# Patient Record
Sex: Male | Born: 1980 | Race: Black or African American | Hispanic: No | Marital: Single | Smoking: Current every day smoker
Health system: Southern US, Community
[De-identification: ages and names within clinical notes are randomized; demographics above are authoritative.]

---

## 2003-09-25 ENCOUNTER — Emergency Department (HOSPITAL_COMMUNITY): Admission: EM | Admit: 2003-09-25 | Discharge: 2003-09-25 | Payer: Self-pay | Admitting: Emergency Medicine

## 2003-11-22 ENCOUNTER — Emergency Department (HOSPITAL_COMMUNITY): Admission: AD | Admit: 2003-11-22 | Discharge: 2003-11-22 | Payer: Self-pay | Admitting: Emergency Medicine

## 2004-06-12 ENCOUNTER — Emergency Department (HOSPITAL_COMMUNITY): Admission: AC | Admit: 2004-06-12 | Discharge: 2004-06-12 | Payer: Self-pay

## 2016-12-10 ENCOUNTER — Encounter (HOSPITAL_COMMUNITY): Payer: Self-pay

## 2016-12-10 ENCOUNTER — Emergency Department (HOSPITAL_COMMUNITY)
Admission: EM | Admit: 2016-12-10 | Discharge: 2016-12-10 | Disposition: A | Discharge status: Home / Self Care | Payer: No Typology Code available for payment source | Attending: Emergency Medicine | Admitting: Emergency Medicine

## 2016-12-10 DIAGNOSIS — S39012A Strain of muscle, fascia and tendon of lower back, initial encounter: Secondary | ICD-10-CM | POA: Insufficient documentation

## 2016-12-10 DIAGNOSIS — Y999 Unspecified external cause status: Secondary | ICD-10-CM | POA: Insufficient documentation

## 2016-12-10 DIAGNOSIS — Y939 Activity, unspecified: Secondary | ICD-10-CM | POA: Diagnosis not present

## 2016-12-10 DIAGNOSIS — M62838 Other muscle spasm: Secondary | ICD-10-CM

## 2016-12-10 DIAGNOSIS — T148XXA Other injury of unspecified body region, initial encounter: Secondary | ICD-10-CM

## 2016-12-10 DIAGNOSIS — S29012A Strain of muscle and tendon of back wall of thorax, initial encounter: Secondary | ICD-10-CM | POA: Diagnosis not present

## 2016-12-10 DIAGNOSIS — Y92410 Unspecified street and highway as the place of occurrence of the external cause: Secondary | ICD-10-CM | POA: Insufficient documentation

## 2016-12-10 DIAGNOSIS — S299XXA Unspecified injury of thorax, initial encounter: Secondary | ICD-10-CM | POA: Diagnosis present
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
## 2016-12-13 ENCOUNTER — Ambulatory Visit (INDEPENDENT_AMBULATORY_CARE_PROVIDER_SITE_OTHER): Payer: Self-pay | Admitting: Physician Assistant

## 2016-12-13 ENCOUNTER — Telehealth (INDEPENDENT_AMBULATORY_CARE_PROVIDER_SITE_OTHER): Payer: Self-pay | Admitting: Physician Assistant

## 2016-12-13 ENCOUNTER — Ambulatory Visit (HOSPITAL_COMMUNITY)
Admission: RE | Admit: 2016-12-13 | Discharge: 2016-12-13 | Disposition: A | Discharge status: Home / Self Care | Payer: No Typology Code available for payment source | Source: Ambulatory Visit | Attending: Physician Assistant | Admitting: Physician Assistant

## 2016-12-13 ENCOUNTER — Encounter (INDEPENDENT_AMBULATORY_CARE_PROVIDER_SITE_OTHER): Payer: Self-pay | Admitting: Physician Assistant

## 2016-12-13 VITALS — BP 125/66 | HR 81 | Temp 98.0°F | Ht 66.0 in | Wt 157.6 lb

## 2016-12-13 DIAGNOSIS — M545 Low back pain: Secondary | ICD-10-CM | POA: Insufficient documentation

## 2016-12-13 DIAGNOSIS — M5134 Other intervertebral disc degeneration, thoracic region: Secondary | ICD-10-CM | POA: Insufficient documentation

## 2016-12-13 DIAGNOSIS — M5136 Other intervertebral disc degeneration, lumbar region: Secondary | ICD-10-CM | POA: Insufficient documentation

## 2016-12-13 DIAGNOSIS — S39012A Strain of muscle, fascia and tendon of lower back, initial encounter: Secondary | ICD-10-CM

## 2016-12-13 DIAGNOSIS — S3992XA Unspecified injury of lower back, initial encounter: Secondary | ICD-10-CM

## 2016-12-13 MED ORDER — CYCLOBENZAPRINE HCL 5 MG PO TABS: 5.0000 mg | ORAL_TABLET | Freq: Three times a day (TID) | ORAL | 0 refills | Status: DC | PRN

## 2016-12-13 MED ORDER — IBUPROFEN 600 MG PO TABS: 600.0000 mg | ORAL_TABLET | Freq: Three times a day (TID) | ORAL | 0 refills | Status: DC | PRN

## 2016-12-13 NOTE — Telephone Encounter (Signed)
[facility]

## 2016-12-13 NOTE — Patient Instructions (Signed)
Back Pain, Adult  Back pain is very common. The pain often gets better over time. The cause of back pain is usually not dangerous. Most people can learn to manage their back pain on their own.  Follow these instructions at home:  Watch your back pain for any changes. The following actions may help to lessen any pain you are feeling:  · Stay active. Start with short walks on flat ground if you can. Try to walk farther each day.  · Exercise regularly as told by your doctor. Exercise helps your back heal faster. It also helps avoid future injury by keeping your muscles strong and flexible.  · Do not sit, drive, or stand in one place for more than 30 minutes.  · Do not stay in bed. Resting more than 1-2 days can slow down your recovery.  · Be careful when you bend or lift an object. Use good form when lifting:  ? Bend at your knees.  ? Keep the object close to your body.  ? Do not twist.  · Sleep on a firm mattress. Lie on your side, and bend your knees. If you lie on your back, put a pillow under your knees.  · Take medicines only as told by your doctor.  · Put ice on the injured area.  ? Put ice in a plastic bag.  ? Place a towel between your skin and the bag.  ? Leave the ice on for 20 minutes, 2-3 times a day for the first 2-3 days. After that, you can switch between ice and heat packs.  · Avoid feeling anxious or stressed. Find good ways to deal with stress, such as exercise.  · Maintain a healthy weight. Extra weight puts stress on your back.    Contact a doctor if:  · You have pain that does not go away with rest or medicine.  · You have worsening pain that goes down into your legs or buttocks.  · You have pain that does not get better in one week.  · You have pain at night.  · You lose weight.  · You have a fever or chills.  Get help right away if:  · You cannot control when you poop (bowel movement) or pee (urinate).  · Your arms or legs feel weak.  · Your arms or legs lose feeling (numbness).  · You feel sick  to your stomach (nauseous) or throw up (vomit).  · You have belly (abdominal) pain.  · You feel like you may pass out (faint).  This information is not intended to replace advice given to you by your health care provider. Make sure you discuss any questions you have with your health care provider.  Document Released: 03/13/2008 Document Revised: 03/02/2016 Document Reviewed: 01/27/2014  Elsevier Interactive Patient Education © 2017 [employer]
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
## 2016-12-14 MED ORDER — CYCLOBENZAPRINE HCL 5 MG PO TABS: 5.0000 mg | ORAL_TABLET | Freq: Three times a day (TID) | ORAL | 0 refills | Status: DC | PRN

## 2016-12-14 MED ORDER — IBUPROFEN 600 MG PO TABS: 600.0000 mg | ORAL_TABLET | Freq: Three times a day (TID) | ORAL | 0 refills | Status: DC | PRN

## 2016-12-14 NOTE — Telephone Encounter (Signed)
Will send meds to [facility] now.

## 2016-12-14 NOTE — Addendum Note (Signed)
Addended by: Sindy MessingGOMEZ, Vincente Asbridge D on: 12/14/2016 08:30 AM   Modules accepted: Orders

## 2017-01-10 ENCOUNTER — Ambulatory Visit (INDEPENDENT_AMBULATORY_CARE_PROVIDER_SITE_OTHER): Payer: Self-pay | Admitting: Physician Assistant

## 2017-01-10 ENCOUNTER — Encounter (INDEPENDENT_AMBULATORY_CARE_PROVIDER_SITE_OTHER): Payer: Self-pay | Admitting: Physician Assistant

## 2017-01-10 VITALS — BP 113/71 | HR 84 | Temp 98.1°F | Ht 66.0 in | Wt 149.2 lb

## 2017-01-10 DIAGNOSIS — S39012D Strain of muscle, fascia and tendon of lower back, subsequent encounter: Secondary | ICD-10-CM

## 2017-01-10 MED ORDER — IBUPROFEN 600 MG PO TABS: 600.0000 mg | ORAL_TABLET | Freq: Three times a day (TID) | ORAL | 0 refills | Status: DC | PRN

## 2017-01-10 NOTE — Progress Notes (Signed)
Pt presents for a F/U for back pain  Pt states his back feels better, but the type of work he does make it worse  Pt states that heat and ice does help with the pain  Pt does not complain of any pain today  Pt states he is not taking the prescribed medication, as he is not a fan of medication
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
## 2017-01-10 NOTE — Patient Instructions (Signed)
Low Back Strain A strain is a stretch or tear in a muscle or the strong cords of tissue that attach muscle to bone (tendons). Strains of the lower back (lumbar spine) are a common cause of low back pain. A strain occurs when muscles or tendons are torn or are stretched beyond their limits. The muscles may become inflamed, resulting in pain and sudden muscle tightening (spasms). A strain can happen suddenly due to an injury (trauma), or it can develop gradually due to overuse. There are three types of strains:  Grade 1 is a mild strain involving a minor tear of the muscle fibers or tendons. This may cause some pain but no loss of muscle strength.  Grade 2 is a moderate strain involving a partial tear of the muscle fibers or tendons. This causes more severe pain and some loss of muscle strength.  Grade 3 is a severe strain involving a complete tear of the muscle or tendon. This causes severe pain and complete or nearly complete loss of muscle strength. What are the causes? This condition may be caused by:  Trauma, such as a fall or a hit to the body.  Twisting or overstretching the back. This may result from doing activities that require a lot of energy, such as lifting heavy objects. What increases the risk? The following factors may increase your risk of getting this condition:  Playing contact sports.  Participating in sports or activities that put excessive stress on the back and require a lot of bending and twisting, including:  Lifting weights or heavy objects.  Gymnastics.  Soccer.  Figure skating.  Snowboarding.  Being overweight or obese.  Having poor strength and flexibility. What are the signs or symptoms? Symptoms of this condition may include:  Sharp or dull pain in the lower back that does not go away. Pain may extend to the buttocks.  Stiffness.  Limited range of motion.  Inability to stand up straight due to stiffness or pain.  Muscle spasms. How is this  diagnosed?   This condition may be diagnosed based on:  Your symptoms.  Your medical history.  A physical exam.  Your health care provider may push on certain areas of your back to determine the source of your pain.  You may be asked to bend forward, backward, and side to side to assess the severity of your pain and your range of motion.  Imaging tests, such as:  X-rays.  MRI. How is this treated? Treatment for this condition may include:  Applying heat and cold to the affected area.  Medicines to help relieve pain and to relax your muscles (muscle relaxants).  NSAIDs to help reduce swelling and discomfort.  Physical therapy. When your symptoms improve, it is important to gradually return to your normal routine as soon as possible to reduce pain, avoid stiffness, and avoid loss of muscle strength. Generally, symptoms should improve within 6 weeks of treatment. However, recovery time varies. Follow these instructions at home: Managing pain, stiffness, and swelling  If directed, apply ice to the injured area during the first 24 hours after your injury.  Put ice in a plastic bag.  Place a towel between your skin and the bag.  Leave the ice on for 20 minutes, 2-3 times a day.  If directed, apply heat to the affected area as often as told by your health care provider. Use the heat source that your health care provider recommends, such as a moist heat pack or a heating pad.    Place a towel between your skin and the heat source.  Leave the heat on for 20-30 minutes.  Remove the heat if your skin turns bright red. This is especially important if you are unable to feel pain, heat, or cold. You may have a greater risk of getting burned. Activity  Rest and return to your normal activities as told by your health care provider. Ask your health care provider what activities are safe for you.  Avoid activities that take a lot of effort (are strenuous) for as long as told by your  health care provider.  Do exercises as told by your health care provider. General instructions  Take over-the-counter and prescription medicines only as told by your health care provider.  If you have questions or concerns about safety while taking pain medicine, talk with your health care provider.  Do not drive or operate heavy machinery until you know how your pain medicine affects you.  Do not use any tobacco products, such as cigarettes, chewing tobacco, and e-cigarettes. Tobacco can delay bone healing. If you need help quitting, ask your health care provider.  Keep all follow-up visits as told by your health care provider. This is important. How is this prevented?  Warm up and stretch before being active.  Cool down and stretch after being active.  Give your body time to rest between periods of activity.  Avoid:  Being physically inactive for long periods at a time.  Exercising or playing sports when you are tired or in pain.  Use correct form when playing sports and lifting heavy objects.  Use good posture when sitting and standing.  Maintain a healthy weight.  Sleep on a mattress with medium firmness to support your back.  Make sure to use equipment that fits you, including shoes that fit well.  Be safe and responsible while being active to avoid falls.  Do at least 150 minutes of moderate-intensity exercise each week, such as brisk walking or water aerobics. Try a form of exercise that takes stress off your back, such as swimming or stationary cycling.  Maintain physical fitness, including:  Strength.  Flexibility.  Cardiovascular fitness.  Endurance. Contact a health care provider if:  Your back pain does not improve after 6 weeks of treatment.  Your symptoms get worse. Get help right away if:  Your back pain is severe.  You are unable to stand or walk.  You develop pain in your legs.  You develop weakness in your buttocks or legs.  You have  difficulty controlling when you urinate or when you have a bowel movement. This information is not intended to replace advice given to you by your health care provider. Make sure you discuss any questions you have with your health care provider. Document Released: 09/25/2005 Document Revised: 06/01/2016 Document Reviewed: 07/07/2015 Elsevier Interactive Patient Education  2017 [employer]  

## 2017-01-16 ENCOUNTER — Ambulatory Visit: Payer: No Typology Code available for payment source | Attending: Physician Assistant

## 2017-01-16 DIAGNOSIS — R293 Abnormal posture: Secondary | ICD-10-CM | POA: Insufficient documentation

## 2017-01-16 DIAGNOSIS — M62830 Muscle spasm of back: Secondary | ICD-10-CM

## 2017-01-16 DIAGNOSIS — S39012D Strain of muscle, fascia and tendon of lower back, subsequent encounter: Secondary | ICD-10-CM | POA: Insufficient documentation

## 2017-01-16 DIAGNOSIS — M25652 Stiffness of left hip, not elsewhere classified: Secondary | ICD-10-CM | POA: Diagnosis present

## 2017-01-16 DIAGNOSIS — M2560 Stiffness of unspecified joint, not elsewhere classified: Secondary | ICD-10-CM | POA: Diagnosis present

## 2017-01-16 DIAGNOSIS — X58XXXD Exposure to other specified factors, subsequent encounter: Secondary | ICD-10-CM | POA: Insufficient documentation

## 2017-01-16 NOTE — Patient Instructions (Addendum)
From cabinet stretching  bilateral knee to chest , LTR, PPT, hamstring stretch   2x/da y  30 sec or more 2-3 reps                                                                                                                                              Sleeping on Back  Place pillow under knees. A pillow with cervical support and a roll around waist are also helpful. Copyright  VHI. All rights reserved.  Sleeping on Side Place pillow between knees. Use cervical support under neck and a roll around waist as needed. Copyright  VHI. All rights reserved.   Sleeping on Stomach   If this is the only desirable sleeping position, place pillow under lower legs, and under stomach or chest as needed.  Posture - Sitting   Sit upright, head facing forward. Try using a roll to support lower back. Keep shoulders relaxed, and avoid rounded back. Keep hips level with knees. Avoid crossing legs for long periods. Stand to Sit / Sit to Stand   To sit: Bend knees to lower self onto front edge of chair, then scoot back on seat. To stand: Reverse sequence by placing one foot forward, and scoot to front of seat. Use rocking motion to stand up.   Work Height and Reach  Ideal work height is no more than 2 to 4 inches below elbow level when standing, and at elbow level when sitting. Reaching should be limited to arm's length, with elbows slightly bent.  Bending  Bend at hips and knees, not back. Keep feet shoulder-width apart.    Posture - Standing   Good posture is important. Avoid slouching and forward head thrust. Maintain curve in low back and align ears over shoul- ders, hips over ankles.  Alternating Positions   Alternate tasks and change positions frequently to reduce fatigue and muscle tension. Take rest breaks. Computer Work   Position work to Art gallery manager. Use proper work and seat height. Keep shoulders back and down, wrists straight, and elbows at right angles. Use chair that provides  full back support. Add footrest and lumbar roll as needed.  Getting Into / Out of Car  Lower self onto seat, scoot back, then bring in one leg at a time. Reverse sequence to get out.  Dressing  Lie on back to pull socks or slacks over feet, or sit and bend leg while keeping back straight.    Housework - Sink  Place one foot on ledge of cabinet under sink when standing at sink for prolonged periods.   Pushing / Pulling  Pushing is preferable to pulling. Keep back in proper alignment, and use leg muscles to do the work.  Deep Squat   Squat and lift with both arms held against upper trunk. Tighten stomach muscles without holding breath. Use smooth movements to avoid jerking.  Avoid Twisting   Avoid twisting or bending back. Pivot around using foot movements, and bend at knees if needed when reaching for articles.  Carrying Luggage   Distribute weight evenly on both sides. Use a cart whenever possible. Do not twist trunk. Move body as a unit.   Lifting Principles .Maintain proper posture and head alignment. .Slide object as close as possible before lifting. .Move obstacles out of the way. .Test before lifting; ask for help if too heavy. .Tighten stomach muscles without holding breath. .Use smooth movements; do not jerk. .Use legs to do the work, and pivot with feet. .Distribute the work load symmetrically and close to the center of trunk. .Push instead of pull whenever possible.   Ask For Help   Ask for help and delegate to others when possible. Coordinate your movements when lifting together, and maintain the low back curve.  Log Roll   Lying on back, bend left knee and place left arm across chest. Roll all in one movement to the right. Reverse to roll to the left. Always move as one unit. Housework - Sweeping  Use long-handled equipment to avoid stooping.   Housework - Wiping  Position yourself as close as possible to reach work surface. Avoid straining  your back.  Laundry - Unloading Wash   To unload small items at bottom of washer, lift leg opposite to arm being used to reach.  Gardening - Raking  Move close to area to be raked. Use arm movements to do the work. Keep back straight and avoid twisting.     Cart  When reaching into cart with one arm, lift opposite leg to keep back straight.   Getting Into / Out of Bed  Lower self to lie down on one side by raising legs and lowering head at the same time. Use arms to assist moving without twisting. Bend both knees to roll onto back if desired. To sit up, start from lying on side, and use same move-ments in reverse. Housework - Vacuuming  Hold the vacuum with arm held at side. Step back and forth to move it, keeping head up. Avoid twisting.   Laundry - Armed forces training and education officer so that bending and twisting can be avoided.   Laundry - Unloading Dryer  Squat down to reach into clothes dryer or use a reacher.  Gardening - Weeding / Psychiatric nurse or Kneel. Knee pads may be helpful.
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
## 2017-01-19 ENCOUNTER — Ambulatory Visit: Payer: No Typology Code available for payment source | Admitting: Physical Therapy

## 2017-01-19 ENCOUNTER — Encounter: Payer: Self-pay | Admitting: Physical Therapy

## 2017-01-19 DIAGNOSIS — S39012D Strain of muscle, fascia and tendon of lower back, subsequent encounter: Secondary | ICD-10-CM | POA: Diagnosis not present

## 2017-01-19 DIAGNOSIS — M62830 Muscle spasm of back: Secondary | ICD-10-CM

## 2017-01-19 DIAGNOSIS — M25652 Stiffness of left hip, not elsewhere classified: Secondary | ICD-10-CM

## 2017-01-19 DIAGNOSIS — R293 Abnormal posture: Secondary | ICD-10-CM

## 2017-01-19 DIAGNOSIS — M2560 Stiffness of unspecified joint, not elsewhere classified: Secondary | ICD-10-CM

## 2017-01-19 NOTE — Therapy (Signed)
[facility] [address] [place], [place], [address] Phone: [phone]   Fax:  [phone]  Physical Therapy Treatment  Patient Details  Name: Roger Dixon MRN: [id number] Date of Birth: Feb 14, 1981 Referring Provider: Sindy Messing, MD  Encounter Date: 01/19/2017      PT End of Session - 01/19/17 1158    Visit Number 2   Number of Visits 10   Date for PT Re-Evaluation 02/16/17   Authorization Type Self pay   PT Start Time 0800   PT Stop Time 0845   PT Time Calculation (min) 45 min   Activity Tolerance Patient tolerated treatment well;No increased pain   Behavior During Therapy [facility] for tasks assessed/performed      History reviewed. No pertinent past medical history.  History reviewed. No pertinent surgical history.  There were no vitals filed for this visit.      Subjective Assessment - 01/19/17 0803    Subjective Continues with some back pain. Experiences pain during the later parts of the work day. No problems at home. Reports doing his HEP two times a day.   How long can you walk comfortably? As needed but at work pain after lifting   Patient Stated Goals Decrease pain so can continue working.   Currently in Pain? Yes   Pain Score 4    Pain Location Back   Pain Orientation Right;Lower;Mid   Pain Descriptors / Indicators Sore   Pain Frequency Intermittent   Pain Relieving Factors meds, heat                         [facility] Adult PT Treatment/Exercise - 01/19/17 0001      Exercises   Exercises Lumbar     Lumbar Exercises: Stretches   Active Hamstring Stretch 3 reps;30 seconds   Active Hamstring Stretch Limitations strap around foot   Double Knee to Chest Stretch 3 reps;30 seconds   Lower Trunk Rotation Limitations 10 reps; both sides   Pelvic Tilt Limitations 10 reps   Piriformis Stretch 30 seconds;2 reps     Lumbar Exercises: Seated   Sit to Stand 10 reps   Sit to Stand Limitations  with cane along back for postural cues     Lumbar Exercises: Supine   Clam 10 reps   Bent Knee Raise 20 reps   Bent Knee Raise Limitations legs to table one at a time, then lower one at a time, then progressed to alternating bent knee raises    Bridge 10 reps   Straight Leg Raise 10 reps   Straight Leg Raises Limitations with abdominal brace    Other Supine Lumbar Exercises bridges with clam shells; red; 10 reps                PT Education - 01/19/17 1157    Education provided Yes   Education Details HEP, stretchig, posture and body mechanics while lifting   Person(s) Educated Patient   Methods Explanation;Demonstration;Verbal cues;Handout   Comprehension Verbalized understanding;Returned demonstration          PT Short Term Goals - 01/16/17 0920      PT SHORT TERM GOAL #1   Title He will be independnet with intial HEP    Time 2   Period Weeks   Status New           PT Long Term Goals - 01/16/17 0920      PT LONG TERM GOAL #1   Title  He will be independent with all HEP issued   Time 5   Period Weeks   Status New     PT LONG TERM GOAL #2   Title He will report pain decreased 75% or more with      home activity   Time 5   Period Weeks   Status New     PT LONG TERM GOAL #3   Title He will demo awareness of good posture and lifting mechanics   Time 5   Period Weeks   Status New     PT LONG TERM GOAL #4   Title He will report pain decreased 50%  with lifting at work.    Time 5   Period Weeks   Status New     PT LONG TERM GOAL #5   Title lumbar flexion will increase to 70 degress due to less pain.    Time 5   Period Weeks   Status New               Plan - 01/19/17 1158    Clinical Impression Statement Pt reported a 4/10 back pain. He stated that most of his pain occurs at work and that exercises performed today along with his HEP help his pain. He performed his HEP with correct technique with min verbal cues. Started some supine lumbar  stabilization exercises including marching, SLRs, table top, and bridges. Able to perform those with good technique. Worked on proper lifting techniques while maintaining neutral spine. Pt demoed his understanding. Stated that he was pain free after treatment.   Rehab Potential Good   PT Treatment/Interventions Electrical Stimulation;Moist Heat;Passive range of motion;Patient/family education;Manual techniques;Therapeutic exercise;Therapeutic activities;Dry needling   PT Next Visit Plan Progress Stabilization exercises to standing, modalities as needed, stretching, posture/ mechanics with straight spine; add rows with theraband   PT Home Exercise Plan PPT, Bil knee to chest, LTR, hamstring stretching, bridging with clam shells, table top      Patient will benefit from skilled therapeutic intervention in order to improve the following deficits and impairments:     Visit Diagnosis: Back strain, subsequent encounter  Abnormal posture  Muscle spasm of back  Stiffness of left hip, not elsewhere classified  Joint stiffness of spine     Problem List There are no active problems to display for this patient.   Sharlene Motts, SPTA 01/19/2017, 12:19 PM   Jannette Spanner, PTA  [facility] [address] [place], [place], [address] Phone: [phone]   Fax:  [phone]  Name: Lucious Zou MRN: [id number] Date of Birth: 15-May-1981

## 2017-01-23 ENCOUNTER — Ambulatory Visit: Payer: No Typology Code available for payment source | Admitting: Physical Therapy

## 2017-01-25 ENCOUNTER — Ambulatory Visit: Payer: No Typology Code available for payment source | Admitting: Physical Therapy

## 2017-01-25 ENCOUNTER — Encounter: Payer: Self-pay | Admitting: Physical Therapy

## 2017-01-25 DIAGNOSIS — S39012D Strain of muscle, fascia and tendon of lower back, subsequent encounter: Secondary | ICD-10-CM | POA: Diagnosis not present

## 2017-01-25 DIAGNOSIS — R293 Abnormal posture: Secondary | ICD-10-CM

## 2017-01-25 DIAGNOSIS — M62830 Muscle spasm of back: Secondary | ICD-10-CM

## 2017-01-25 DIAGNOSIS — M25652 Stiffness of left hip, not elsewhere classified: Secondary | ICD-10-CM

## 2017-01-25 DIAGNOSIS — M2560 Stiffness of unspecified joint, not elsewhere classified: Secondary | ICD-10-CM

## 2017-01-25 NOTE — Therapy (Addendum)
[place], [place], [address] Phone: [phone]   Fax:  [phone]  Physical Therapy Treatment/Discharge  Patient Details  Name: Roger Dixon MRN: [id number] Date of Birth: 01-Sep-1981 Referring Provider: Domenica Fail, MD  Encounter Date: 01/25/2017      PT End of Session - 01/25/17 1310    Visit Number 3   Number of Visits 10   Date for PT Re-Evaluation 02/16/17   Authorization Type Self pay   PT Start Time 1100   PT Stop Time 1145   PT Time Calculation (min) 45 min   Activity Tolerance Patient tolerated treatment well;No increased pain   Behavior During Therapy [facility] for tasks assessed/performed      History reviewed. No pertinent past medical history.  History reviewed. No pertinent surgical history.  There were no vitals filed for this visit.      Subjective Assessment - 01/25/17 1103    Subjective No pain. Has not been experiencing any back pain. Has stepped away from his job for a while so that is helping with pain.   Currently in Pain? No/denies   Aggravating Factors  bending, lifting,    Pain Relieving Factors good posture                         [facility] Adult PT Treatment/Exercise - 01/25/17 0001      Lumbar Exercises: Stretches   Double Knee to Chest Stretch 3 reps;30 seconds   Lower Trunk Rotation Limitations 10 reps; both sides     Lumbar Exercises: Standing   Row 20 reps   Theraband Level (Row) Level 2 (Red)     Lumbar Exercises: Supine   Bent Knee Raise 10 reps   Bent Knee Raise Limitations legs to table one at a time, then lower one at a time, then progressed to alternating bent knee raises    Bridge 10 reps   Other Supine Lumbar Exercises bridges with alternating straight leg raise x 10 each; bridges with knees on exercise ball x 10;, with exercise ball under feet x 10   Other Supine Lumbar Exercises Lateral trunk rotation with excerise ball under knees  x 10 each side     Lumbar Exercises: Quadruped   Madcat/Old Horse 10 reps   Single Arm Raise 10 reps;Right;Left                PT Education - 01/25/17 1309    Education provided Yes   Education Details HEP, exercise form/rationale   Person(s) Educated Patient   Methods Explanation;Demonstration;Verbal cues;Handout   Comprehension Verbalized understanding;Returned demonstration          PT Short Term Goals - 01/25/17 1324      PT SHORT TERM GOAL #1   Title He will be independnet with intial HEP    Baseline independent with all exercises expect bridges with clam shells   Time 2   Period Weeks   Status Partially Met           PT Long Term Goals - 01/16/17 0920      PT LONG TERM GOAL #1   Title He will be independent with all HEP issued   Time 5   Period Weeks   Status New     PT LONG TERM GOAL #2   Title He will report pain decreased 75% or more with      home activity   Time 5   Period Weeks  Status New     PT LONG TERM GOAL #3   Title He will demo awareness of good posture and lifting mechanics   Time 5   Period Weeks   Status New     PT LONG TERM GOAL #4   Title He will report pain decreased 50%  with lifting at work.    Time 5   Period Weeks   Status New     PT LONG TERM GOAL #5   Title lumbar flexion will increase to 70 degress due to less pain.    Time 5   Period Weeks   Status New               Plan - 01/25/17 1312    Clinical Impression Statement Pt reported he was not in any pain before, during, or after treatment. He was very excited about taking a break from his job and improving his strength. Pt was able to perform initial HEP with min cues. Worked on supine lumbar stabilization with different variations of bridges iincluding; single leg straightening, knees on exercise ball, and feet on exercise ball to provide more instability causing lumbar stabilizing muscles to activate. Able to perform with good technique. Pt performed  standing rows with red theraband; required mod cues to avoid shoulder hike and increase scapular squeeze. Started exercises in quadriped postion; able to complete single arm raises with some difficulty.   Rehab Potential Good   PT Frequency 2x / week   PT Treatment/Interventions Electrical Stimulation;Moist Heat;Passive range of motion;Patient/family education;Manual techniques;Therapeutic exercise;Therapeutic activities;Dry needling   PT Next Visit Plan Progress Stabilization exercises to standing, modalities as needed, stretching, posture/ mechanics with straight spine; assess tolerance of rows with theraband, find out if pt bought exercise ball; continue quadriped   PT Home Exercise Plan PPT, Bil knee to chest, LTR, hamstring stretching, bridging with clam shells, table top, rows with theraband   Consulted and Agree with Plan of Care Patient      Patient will benefit from skilled therapeutic intervention in order to improve the following deficits and impairments:  Postural dysfunction, Pain, Decreased activity tolerance, Decreased range of motion, Increased muscle spasms  Visit Diagnosis: Back strain, subsequent encounter  Abnormal posture  Muscle spasm of back  Stiffness of left hip, not elsewhere classified  Joint stiffness of spine     Problem List There are no active problems to display for this patient.   Janna Arch, SPTA 01/25/2017, 1:26 PM  [facility] [address] [place], [place], [address] Phone: [phone]   Fax:  [phone]  Name: Roger Dixon MRN: [id number] Date of Birth: 07/11/1981  PHYSICAL THERAPY DISCHARGE SUMMARY  Visits from Start of Care: 3 Current functional level related to goals / functional outcomes: He canceled all appointments after this session due to being pleased with progress   Remaining deficits: See above   Education / Equipment: HEP  Plan: Patient agrees to discharge.   Patient goals were partially met. Patient is being discharged due to being pleased with the current functional level.  ?????   Lillette Boxer Chasse  PT    5/10//18   11:50 AM

## 2017-01-25 NOTE — Patient Instructions (Signed)
Copyright  VHI. All rights reserved.  Resistive Band Rowing   With resistive band anchored in door, grasp both ends. Keeping elbows bent, pull back, squeezing shoulder blades together. Repeat __10__ reps 2 times. Once a day..  [email or web address].exer.[email or web address]   Copyright  VHI. All rights reserved.

## 2017-01-30 ENCOUNTER — Ambulatory Visit: Payer: No Typology Code available for payment source

## 2017-02-01 ENCOUNTER — Ambulatory Visit: Payer: No Typology Code available for payment source | Admitting: Physical Therapy

## 2017-02-06 ENCOUNTER — Ambulatory Visit: Payer: No Typology Code available for payment source | Admitting: Physical Therapy

## 2017-02-07 ENCOUNTER — Ambulatory Visit (INDEPENDENT_AMBULATORY_CARE_PROVIDER_SITE_OTHER): Payer: Self-pay | Admitting: Physician Assistant

## 2017-02-08 ENCOUNTER — Ambulatory Visit: Payer: No Typology Code available for payment source

## 2017-07-05 ENCOUNTER — Emergency Department (HOSPITAL_COMMUNITY): Payer: Self-pay

## 2017-07-05 ENCOUNTER — Emergency Department (HOSPITAL_COMMUNITY)
Admission: EM | Admit: 2017-07-05 | Discharge: 2017-07-05 | Disposition: A | Discharge status: Home / Self Care | Payer: Self-pay | Attending: Emergency Medicine | Admitting: Emergency Medicine

## 2017-07-05 ENCOUNTER — Encounter (HOSPITAL_COMMUNITY): Payer: Self-pay | Admitting: Emergency Medicine

## 2017-07-05 DIAGNOSIS — F17210 Nicotine dependence, cigarettes, uncomplicated: Secondary | ICD-10-CM | POA: Insufficient documentation

## 2017-07-05 DIAGNOSIS — M25512 Pain in left shoulder: Secondary | ICD-10-CM | POA: Insufficient documentation

## 2017-07-05 MED ORDER — IBUPROFEN 200 MG PO TABS
600.0000 mg | ORAL_TABLET | Freq: Once | ORAL | Status: AC
  Administered 2017-07-05: 600 mg via ORAL
  Filled 2017-07-05: qty 3

## 2017-07-05 MED ORDER — IBUPROFEN 600 MG PO TABS: 600.0000 mg | ORAL_TABLET | Freq: Four times a day (QID) | ORAL | 0 refills | Status: DC | PRN

## 2017-07-05 NOTE — ED Triage Notes (Signed)
Pt states he works at [employer] and does a lot of heavy lifting  Pt states a couple days ago he was lifting a heavy box and felt a pulling sensation in his left shoulder  Pt states since then he has had pain in his left shoulder and back area  Pt states the pain increases with movement

## 2017-07-05 NOTE — Discharge Instructions (Signed)
Please read and follow all provided instructions.  You have been seen today for left shoulder pain. Your pain is likely due to a muscle strain.   Tests performed today include: An x-ray of the affected area - does NOT show any broken bones or dislocations.  Vital signs. See below for your results today.   Home care instructions: -- *PRICE in the first 24-48 hours after injury: Protect (with brace, splint, sling), if given by your provider Rest Ice- Do not apply ice pack directly to your skin, place towel or similar between your skin and ice/ice pack. Apply ice for 20 min, then remove for 40 min while awake Compression- Wear brace, elastic bandage, splint as directed by your provider Elevate affected extremity above the level of your heart when not walking around for the first 24-48 hours   Use Ibuprofen (Motrin/Advil)  every 6 hours as needed for pain (do not exceed max dose in 24 hours, )  Follow-up instructions: Please follow-up with your primary care provider or the provided orthopedic physician (bone specialist) if you continue to have significant pain in 1 week. In this case you may have a more severe injury that requires further care.   Return instructions:  Please return if your toes or feet are numb or tingling, appear gray or blue, or you have severe pain (also elevate the leg and loosen splint or wrap if you were given one) Please return to the Emergency Department if you experience worsening symptoms.  Please return if you have any other emergent concerns. Additional Information:  Your vital signs today were: BP 116/72    Pulse 74    Temp 97.7 F (36.5 C)    Resp 18    Wt 66.7 kg (147 lb)    SpO2 100%    BMI 23.73 kg/m  If your blood pressure (BP) was elevated above 135/85 this visit, please have this repeated by your doctor within one month. ---------------
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
## 2018-04-29 ENCOUNTER — Encounter: Payer: Self-pay | Admitting: Emergency Medicine

## 2018-04-29 ENCOUNTER — Emergency Department
Admission: EM | Admit: 2018-04-29 | Discharge: 2018-04-29 | Disposition: A | Discharge status: Home / Self Care | Payer: Self-pay | Attending: Emergency Medicine | Admitting: Emergency Medicine

## 2018-04-29 ENCOUNTER — Emergency Department: Payer: Self-pay

## 2018-04-29 DIAGNOSIS — S0502XA Injury of conjunctiva and corneal abrasion without foreign body, left eye, initial encounter: Secondary | ICD-10-CM | POA: Insufficient documentation

## 2018-04-29 DIAGNOSIS — Y999 Unspecified external cause status: Secondary | ICD-10-CM | POA: Insufficient documentation

## 2018-04-29 DIAGNOSIS — W500XXA Accidental hit or strike by another person, initial encounter: Secondary | ICD-10-CM | POA: Insufficient documentation

## 2018-04-29 DIAGNOSIS — F17200 Nicotine dependence, unspecified, uncomplicated: Secondary | ICD-10-CM | POA: Insufficient documentation

## 2018-04-29 DIAGNOSIS — Y939 Activity, unspecified: Secondary | ICD-10-CM | POA: Insufficient documentation

## 2018-04-29 DIAGNOSIS — S0512XA Contusion of eyeball and orbital tissues, left eye, initial encounter: Secondary | ICD-10-CM | POA: Insufficient documentation

## 2018-04-29 DIAGNOSIS — Y929 Unspecified place or not applicable: Secondary | ICD-10-CM | POA: Insufficient documentation

## 2018-04-29 MED ORDER — TETRACAINE HCL 0.5 % OP SOLN
1.0000 [drp] | Freq: Once | OPHTHALMIC | Status: AC
  Administered 2018-04-29: 1 [drp] via OPHTHALMIC

## 2018-04-29 MED ORDER — GENTAMICIN SULFATE 0.3 % OP SOLN: 1.0000 [drp] | OPHTHALMIC | 0 refills | Status: DC

## 2018-04-29 MED ORDER — FLUORESCEIN SODIUM 1 MG OP STRP
ORAL_STRIP | OPHTHALMIC | Status: AC
  Administered 2018-04-29: 1 via OPHTHALMIC
  Filled 2018-04-29: qty 1

## 2018-04-29 MED ORDER — OLOPATADINE HCL 0.2 % OP SOLN: 1.0000 [drp] | Freq: Every morning | OPHTHALMIC | 0 refills | Status: DC

## 2018-04-29 MED ORDER — EYE WASH OPHTH SOLN
OPHTHALMIC | Status: AC
  Administered 2018-04-29: 1 [drp] via OPHTHALMIC
  Filled 2018-04-29: qty 118

## 2018-04-29 MED ORDER — TETRACAINE HCL 0.5 % OP SOLN
OPHTHALMIC | Status: AC
  Administered 2018-04-29: 1 [drp] via OPHTHALMIC
  Filled 2018-04-29: qty 4

## 2018-04-29 MED ORDER — EYE WASH OPHTH SOLN
1.0000 [drp] | OPHTHALMIC | Status: DC | PRN
  Administered 2018-04-29: 1 [drp] via OPHTHALMIC

## 2018-04-29 MED ORDER — FLUORESCEIN SODIUM 1 MG OP STRP
1.0000 | ORAL_STRIP | Freq: Once | OPHTHALMIC | Status: AC
  Administered 2018-04-29: 1 via OPHTHALMIC
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
## 2018-04-29 NOTE — Discharge Instructions (Signed)
Follow discharge care instruction use eyedrops as directed.  Follow-up with ophthalmology if no improvement in 3 days.

## 2018-09-08 ENCOUNTER — Encounter: Payer: Self-pay | Admitting: Emergency Medicine

## 2018-09-08 ENCOUNTER — Emergency Department
Admission: EM | Admit: 2018-09-08 | Discharge: 2018-09-08 | Disposition: A | Discharge status: Home / Self Care | Payer: Self-pay | Attending: Emergency Medicine | Admitting: Emergency Medicine

## 2018-09-08 DIAGNOSIS — Z79899 Other long term (current) drug therapy: Secondary | ICD-10-CM | POA: Insufficient documentation

## 2018-09-08 DIAGNOSIS — R590 Localized enlarged lymph nodes: Secondary | ICD-10-CM | POA: Insufficient documentation

## 2018-09-08 DIAGNOSIS — F17200 Nicotine dependence, unspecified, uncomplicated: Secondary | ICD-10-CM | POA: Insufficient documentation

## 2018-09-08 DIAGNOSIS — R599 Enlarged lymph nodes, unspecified: Secondary | ICD-10-CM

## 2018-09-08 DIAGNOSIS — L739 Follicular disorder, unspecified: Secondary | ICD-10-CM | POA: Insufficient documentation

## 2018-09-08 MED ORDER — SULFAMETHOXAZOLE-TRIMETHOPRIM 800-160 MG PO TABS: 1.0000 | ORAL_TABLET | Freq: Two times a day (BID) | ORAL | 0 refills | Status: DC

## 2018-09-08 NOTE — ED Notes (Signed)
Pt with noted abscess to right face.

## 2018-09-08 NOTE — ED Triage Notes (Signed)
Patient with complaint of 3 "knots" that came up on the right side of his neck on Thursday. Patient states that those "knots" have gotten smaller but that he has had 2 more come up.
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
## 2018-12-24 ENCOUNTER — Emergency Department
Admission: EM | Admit: 2018-12-24 | Discharge: 2018-12-24 | Disposition: A | Discharge status: Home / Self Care | Payer: Self-pay | Attending: Emergency Medicine | Admitting: Emergency Medicine

## 2018-12-24 ENCOUNTER — Other Ambulatory Visit: Payer: Self-pay

## 2018-12-24 DIAGNOSIS — F17200 Nicotine dependence, unspecified, uncomplicated: Secondary | ICD-10-CM | POA: Insufficient documentation

## 2018-12-24 DIAGNOSIS — R05 Cough: Secondary | ICD-10-CM | POA: Insufficient documentation

## 2018-12-24 DIAGNOSIS — J028 Acute pharyngitis due to other specified organisms: Secondary | ICD-10-CM

## 2018-12-24 DIAGNOSIS — B349 Viral infection, unspecified: Secondary | ICD-10-CM | POA: Insufficient documentation

## 2018-12-24 LAB — INFLUENZA PANEL BY PCR (TYPE A & B)
Influenza A By PCR: NEGATIVE
Influenza B By PCR: NEGATIVE

## 2018-12-24 MED ORDER — AMOXICILLIN 500 MG PO CAPS: 500.0000 mg | ORAL_CAPSULE | Freq: Three times a day (TID) | ORAL | 0 refills | Status: DC

## 2018-12-24 MED ORDER — PSEUDOEPH-BROMPHEN-DM 30-2-10 MG/5ML PO SYRP: 5.0000 mL | ORAL_SOLUTION | Freq: Four times a day (QID) | ORAL | 0 refills | Status: DC | PRN

## 2018-12-24 MED ORDER — LOPERAMIDE HCL 2 MG PO TABS: 2.0000 mg | ORAL_TABLET | Freq: Four times a day (QID) | ORAL | 0 refills | Status: DC | PRN

## 2018-12-24 NOTE — ED Triage Notes (Signed)
Pt arrived via POV with runny nose, cough, diarrhea, and itchy throat that started yesterday.
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
## 2018-12-24 NOTE — ED Notes (Signed)
See triage note  Presents with cough,congestion,sore itchy throat  afebriel on arrival  Denies any recent travel

## 2019-03-23 ENCOUNTER — Emergency Department: Payer: No Typology Code available for payment source

## 2019-03-23 ENCOUNTER — Other Ambulatory Visit: Payer: Self-pay

## 2019-03-23 ENCOUNTER — Emergency Department
Admission: EM | Admit: 2019-03-23 | Discharge: 2019-03-23 | Disposition: A | Discharge status: Home / Self Care | Payer: No Typology Code available for payment source | Attending: Emergency Medicine | Admitting: Emergency Medicine

## 2019-03-23 ENCOUNTER — Encounter: Payer: Self-pay | Admitting: Intensive Care

## 2019-03-23 DIAGNOSIS — S43101A Unspecified dislocation of right acromioclavicular joint, initial encounter: Secondary | ICD-10-CM | POA: Diagnosis not present

## 2019-03-23 DIAGNOSIS — S6992XA Unspecified injury of left wrist, hand and finger(s), initial encounter: Secondary | ICD-10-CM | POA: Diagnosis not present

## 2019-03-23 DIAGNOSIS — Y92410 Unspecified street and highway as the place of occurrence of the external cause: Secondary | ICD-10-CM | POA: Insufficient documentation

## 2019-03-23 DIAGNOSIS — S199XXA Unspecified injury of neck, initial encounter: Secondary | ICD-10-CM | POA: Diagnosis present

## 2019-03-23 DIAGNOSIS — R0789 Other chest pain: Secondary | ICD-10-CM | POA: Insufficient documentation

## 2019-03-23 DIAGNOSIS — Y93I9 Activity, other involving external motion: Secondary | ICD-10-CM | POA: Insufficient documentation

## 2019-03-23 DIAGNOSIS — S0993XA Unspecified injury of face, initial encounter: Secondary | ICD-10-CM | POA: Insufficient documentation

## 2019-03-23 DIAGNOSIS — Y998 Other external cause status: Secondary | ICD-10-CM | POA: Diagnosis not present

## 2019-03-23 DIAGNOSIS — F17210 Nicotine dependence, cigarettes, uncomplicated: Secondary | ICD-10-CM | POA: Diagnosis not present

## 2019-03-23 MED ORDER — ONDANSETRON 4 MG PO TBDP
4.0000 mg | ORAL_TABLET | Freq: Once | ORAL | Status: AC
  Administered 2019-03-23: 4 mg via ORAL
  Filled 2019-03-23: qty 1

## 2019-03-23 MED ORDER — MELOXICAM 15 MG PO TABS: 15.0000 mg | ORAL_TABLET | Freq: Every day | ORAL | 1 refills | Status: AC

## 2019-03-23 MED ORDER — ONDANSETRON 4 MG PO TBDP
4.0000 mg | ORAL_TABLET | Freq: Once | ORAL | Status: AC
  Administered 2019-03-23: 4 mg via ORAL

## 2019-03-23 MED ORDER — TRAMADOL HCL 50 MG PO TABS: 50.0000 mg | ORAL_TABLET | Freq: Four times a day (QID) | ORAL | 0 refills | Status: AC | PRN

## 2019-03-23 MED ORDER — OXYCODONE-ACETAMINOPHEN 5-325 MG PO TABS
1.0000 | ORAL_TABLET | Freq: Once | ORAL | Status: AC
  Administered 2019-03-23: 1 via ORAL
  Filled 2019-03-23: qty 1
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
## 2019-06-12 ENCOUNTER — Other Ambulatory Visit: Payer: Self-pay

## 2019-06-12 ENCOUNTER — Emergency Department
Admission: EM | Admit: 2019-06-12 | Discharge: 2019-06-12 | Disposition: A | Discharge status: Home / Self Care | Payer: Self-pay | Attending: Emergency Medicine | Admitting: Emergency Medicine

## 2019-06-12 ENCOUNTER — Encounter: Payer: Self-pay | Admitting: Emergency Medicine

## 2019-06-12 DIAGNOSIS — Z113 Encounter for screening for infections with a predominantly sexual mode of transmission: Secondary | ICD-10-CM

## 2019-06-12 DIAGNOSIS — Z202 Contact with and (suspected) exposure to infections with a predominantly sexual mode of transmission: Secondary | ICD-10-CM | POA: Insufficient documentation

## 2019-06-12 DIAGNOSIS — F17210 Nicotine dependence, cigarettes, uncomplicated: Secondary | ICD-10-CM | POA: Insufficient documentation

## 2019-06-12 LAB — URINALYSIS, COMPLETE (UACMP) WITH MICROSCOPIC
Bacteria, UA: NONE SEEN
Bilirubin Urine: NEGATIVE
Glucose, UA: NEGATIVE mg/dL
Hgb urine dipstick: NEGATIVE
Ketones, ur: NEGATIVE mg/dL
Leukocytes,Ua: NEGATIVE
Nitrite: NEGATIVE
Protein, ur: NEGATIVE mg/dL
Specific Gravity, Urine: 1.025 (ref 1.005–1.030)
Squamous Epithelial / HPF: NONE SEEN (ref 0–5)
pH: 5 (ref 5.0–8.0)

## 2019-06-12 MED ORDER — AZITHROMYCIN 500 MG PO TABS
1000.0000 mg | ORAL_TABLET | Freq: Once | ORAL | Status: AC
  Administered 2019-06-12: 12:00:00 1000 mg via ORAL
  Filled 2019-06-12: qty 2

## 2019-06-12 MED ORDER — METRONIDAZOLE 500 MG PO TABS
2000.0000 mg | ORAL_TABLET | Freq: Once | ORAL | Status: AC
  Administered 2019-06-12: 12:00:00 2000 mg via ORAL
  Filled 2019-06-12: qty 4

## 2019-06-12 MED ORDER — CEFTRIAXONE SODIUM 250 MG IJ SOLR
250.0000 mg | Freq: Once | INTRAMUSCULAR | Status: AC
  Administered 2019-06-12: 12:00:00 250 mg via INTRAMUSCULAR
  Filled 2019-06-12: qty 250
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
## 2019-06-12 NOTE — ED Notes (Signed)
See triage note  States he is not sure if he was exposed  But has noticed tingling with urination  No discharge

## 2019-06-12 NOTE — ED Triage Notes (Signed)
Std check  Tingling with urination for 2 days.

## 2019-06-16 LAB — GC/CHLAMYDIA PROBE AMP
Chlamydia trachomatis, NAA: NEGATIVE
Neisseria Gonorrhoeae by PCR: NEGATIVE

## 2019-10-22 ENCOUNTER — Other Ambulatory Visit: Payer: Self-pay

## 2019-10-22 ENCOUNTER — Emergency Department: Payer: Self-pay

## 2019-10-22 ENCOUNTER — Encounter: Payer: Self-pay | Admitting: Emergency Medicine

## 2019-10-22 ENCOUNTER — Emergency Department
Admission: EM | Admit: 2019-10-22 | Discharge: 2019-10-22 | Disposition: A | Discharge status: Home / Self Care | Payer: Self-pay | Attending: Emergency Medicine | Admitting: Emergency Medicine

## 2019-10-22 DIAGNOSIS — R0789 Other chest pain: Secondary | ICD-10-CM | POA: Insufficient documentation

## 2019-10-22 DIAGNOSIS — F17210 Nicotine dependence, cigarettes, uncomplicated: Secondary | ICD-10-CM | POA: Insufficient documentation

## 2019-10-22 DIAGNOSIS — Z20822 Contact with and (suspected) exposure to covid-19: Secondary | ICD-10-CM | POA: Insufficient documentation

## 2019-10-22 LAB — BASIC METABOLIC PANEL
Anion gap: 7 (ref 5–15)
BUN: 13 mg/dL (ref 6–20)
CO2: 28 mmol/L (ref 22–32)
Calcium: 9 mg/dL (ref 8.9–10.3)
Chloride: 105 mmol/L (ref 98–111)
Creatinine, Ser: 0.93 mg/dL (ref 0.61–1.24)
GFR calc Af Amer: >60 mL/min (ref >60)
GFR calc non Af Amer: >60 mL/min (ref >60)
Glucose, Bld: 108 mg/dL — ABNORMAL HIGH (ref 70–99)
Potassium: 3.9 mmol/L (ref 3.5–5.1)
Sodium: 140 mmol/L (ref 135–145)

## 2019-10-22 LAB — CBC
HCT: 36 % — ABNORMAL LOW (ref 39.0–52.0)
Hemoglobin: 12.5 g/dL — ABNORMAL LOW (ref 13.0–17.0)
MCH: 28.8 pg (ref 26.0–34.0)
MCHC: 34.7 g/dL (ref 30.0–36.0)
MCV: 82.9 fL (ref 80.0–100.0)
Platelets: 250 10*3/uL (ref 150–400)
RBC: 4.34 MIL/uL (ref 4.22–5.81)
RDW: 13.9 % (ref 11.5–15.5)
WBC: 6.9 10*3/uL (ref 4.0–10.5)
nRBC: 0 % (ref 0.0–0.2)

## 2019-10-22 LAB — TROPONIN I (HIGH SENSITIVITY): Troponin I (High Sensitivity): 7 ng/L (ref <18)

## 2019-10-22 MED ORDER — CYCLOBENZAPRINE HCL 7.5 MG PO TABS: 7.5000 mg | ORAL_TABLET | Freq: Every evening | ORAL | 0 refills | Status: AC | PRN

## 2019-10-22 MED ORDER — IBUPROFEN 800 MG PO TABS
800.0000 mg | ORAL_TABLET | ORAL | Status: AC
  Administered 2019-10-22: 800 mg via ORAL
  Filled 2019-10-22: qty 1

## 2019-10-22 NOTE — ED Triage Notes (Signed)
Reports sharp pain to his mid chest since last pm and some SOB
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
## 2019-10-23 LAB — NOVEL CORONAVIRUS, NAA (HOSP ORDER, SEND-OUT TO REF LAB; TAT 18-24 HRS): SARS-CoV-2, NAA: NOT DETECTED

## 2019-12-06 ENCOUNTER — Other Ambulatory Visit
Admission: EM | Admit: 2019-12-06 | Discharge: 2019-12-06 | Disposition: A | Discharge status: Home / Self Care | Attending: Family Medicine | Admitting: Family Medicine

## 2019-12-06 NOTE — ED Notes (Signed)
Patient brought in by [employer] for forensic blood draw. Verbal consent given by the patient for blood draw. Blood drawn at 00:23 after using betadine to clean the site. Blood was drawn from left AC. Blood given to Officer Pride [employer].

## 2020-05-24 ENCOUNTER — Other Ambulatory Visit: Payer: Self-pay

## 2020-05-24 ENCOUNTER — Emergency Department: Payer: Self-pay

## 2020-05-24 ENCOUNTER — Encounter: Payer: Self-pay | Admitting: Emergency Medicine

## 2020-05-24 ENCOUNTER — Emergency Department
Admission: EM | Admit: 2020-05-24 | Discharge: 2020-05-24 | Disposition: A | Discharge status: Home / Self Care | Payer: Self-pay | Attending: Emergency Medicine | Admitting: Emergency Medicine

## 2020-05-24 DIAGNOSIS — M25561 Pain in right knee: Secondary | ICD-10-CM | POA: Insufficient documentation

## 2020-05-24 DIAGNOSIS — Z79899 Other long term (current) drug therapy: Secondary | ICD-10-CM | POA: Insufficient documentation

## 2020-05-24 MED ORDER — MELOXICAM 15 MG PO TABS: 15.0000 mg | ORAL_TABLET | Freq: Every day | ORAL | 1 refills | Status: AC
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
## 2020-05-24 NOTE — ED Triage Notes (Signed)
Pt to ED from home c/o right knee pain x3 days.  Denies recent injury but has had prior injury in 2005.  Pt ambulatory with steady gait, weight bearing, in NAD at this time.  No obvious deformity or swelling.

## 2020-05-24 NOTE — Discharge Instructions (Signed)
Take Meloxicam daily for pain and inflammation.  

## 2020-05-24 NOTE — ED Notes (Signed)
See triage note. No change from triage note. Pt able to move extremity appropriately.

## 2021-01-18 ENCOUNTER — Emergency Department (HOSPITAL_COMMUNITY): Payer: Self-pay

## 2021-01-18 ENCOUNTER — Emergency Department (HOSPITAL_COMMUNITY)
Admission: EM | Admit: 2021-01-18 | Discharge: 2021-01-18 | Disposition: A | Discharge status: Home / Self Care | Payer: Self-pay | Attending: Emergency Medicine | Admitting: Emergency Medicine

## 2021-01-18 ENCOUNTER — Other Ambulatory Visit: Payer: Self-pay

## 2021-01-18 ENCOUNTER — Encounter (HOSPITAL_COMMUNITY): Payer: Self-pay

## 2021-01-18 DIAGNOSIS — F17210 Nicotine dependence, cigarettes, uncomplicated: Secondary | ICD-10-CM | POA: Insufficient documentation

## 2021-01-18 DIAGNOSIS — M25511 Pain in right shoulder: Secondary | ICD-10-CM | POA: Insufficient documentation

## 2021-01-18 MED ORDER — IBUPROFEN 400 MG PO TABS
600.0000 mg | ORAL_TABLET | Freq: Once | ORAL | Status: AC
  Administered 2021-01-18: 600 mg via ORAL
  Filled 2021-01-18: qty 1

## 2021-01-18 NOTE — ED Notes (Signed)
All appropriate discharge materials reviewed at length with patient. Time for questions provided. Pt has no other questions at this time and verbalizes understanding of all provided materials.  

## 2021-01-18 NOTE — ED Triage Notes (Signed)
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 Pt reports one month of right shoulder pain. Pt has numbness and tingling intermittently in arm.

## 2021-01-18 NOTE — Discharge Instructions (Signed)
Please take Tylenol and ibuprofen as discussed below.  Please follow-up with the [facility]. I recommend gentle stretching.  Warm compresses.  You may benefit from physical therapy.  Please use Tylenol or ibuprofen for pain.  You may use 600 mg ibuprofen every 6 hours or 1000 mg of Tylenol every 6 hours.  You may choose to alternate between the 2.  This would be most effective.  Not to exceed 4 g of Tylenol within 24 hours.  Not to exceed 3200 mg ibuprofen 24 hours.
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
## 2021-02-14 ENCOUNTER — Other Ambulatory Visit: Payer: Self-pay

## 2021-02-14 ENCOUNTER — Emergency Department (HOSPITAL_COMMUNITY)
Admission: EM | Admit: 2021-02-14 | Discharge: 2021-02-14 | Disposition: A | Discharge status: Home / Self Care | Payer: Self-pay | Attending: Emergency Medicine | Admitting: Emergency Medicine

## 2021-02-14 DIAGNOSIS — M792 Neuralgia and neuritis, unspecified: Secondary | ICD-10-CM

## 2021-02-14 DIAGNOSIS — M25511 Pain in right shoulder: Secondary | ICD-10-CM | POA: Insufficient documentation

## 2021-02-14 DIAGNOSIS — F17210 Nicotine dependence, cigarettes, uncomplicated: Secondary | ICD-10-CM | POA: Insufficient documentation

## 2021-02-14 DIAGNOSIS — M5412 Radiculopathy, cervical region: Secondary | ICD-10-CM | POA: Insufficient documentation

## 2021-02-14 MED ORDER — DICLOFENAC SODIUM 50 MG PO TBEC: 50.0000 mg | DELAYED_RELEASE_TABLET | Freq: Two times a day (BID) | ORAL | 0 refills | Status: AC
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
## 2021-02-21 ENCOUNTER — Ambulatory Visit (INDEPENDENT_AMBULATORY_CARE_PROVIDER_SITE_OTHER): Payer: Self-pay

## 2021-02-21 ENCOUNTER — Encounter: Payer: Self-pay | Admitting: Physician Assistant

## 2021-02-21 ENCOUNTER — Ambulatory Visit (INDEPENDENT_AMBULATORY_CARE_PROVIDER_SITE_OTHER): Payer: Self-pay | Admitting: Physician Assistant

## 2021-02-21 DIAGNOSIS — M542 Cervicalgia: Secondary | ICD-10-CM

## 2021-02-21 DIAGNOSIS — M25511 Pain in right shoulder: Secondary | ICD-10-CM

## 2021-02-21 DIAGNOSIS — G8929 Other chronic pain: Secondary | ICD-10-CM

## 2021-02-21 MED ORDER — METHYLPREDNISOLONE 4 MG PO TABS: ORAL_TABLET | ORAL | 0 refills | Status: AC
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
## 2021-02-23 ENCOUNTER — Other Ambulatory Visit: Payer: Self-pay

## 2021-02-23 DIAGNOSIS — G8929 Other chronic pain: Secondary | ICD-10-CM

## 2021-02-23 DIAGNOSIS — M542 Cervicalgia: Secondary | ICD-10-CM

## 2021-03-15 ENCOUNTER — Ambulatory Visit: Payer: Self-pay | Attending: Physician Assistant | Admitting: Physical Therapy

## 2022-03-04 IMAGING — CR DG KNEE COMPLETE 4+V*R*
1 series · 4 of 4 positions shown · non-contrast
Comparison: None.

CLINICAL DATA: Acute right knee pain for 3 days. No reported recent
injury.

EXAM:
RIGHT KNEE - COMPLETE 4+ VIEW

[Series 1: dg knee complete 4 views right · 0.14mm/px · 4 of 4 slices shown]
[im 1/4]
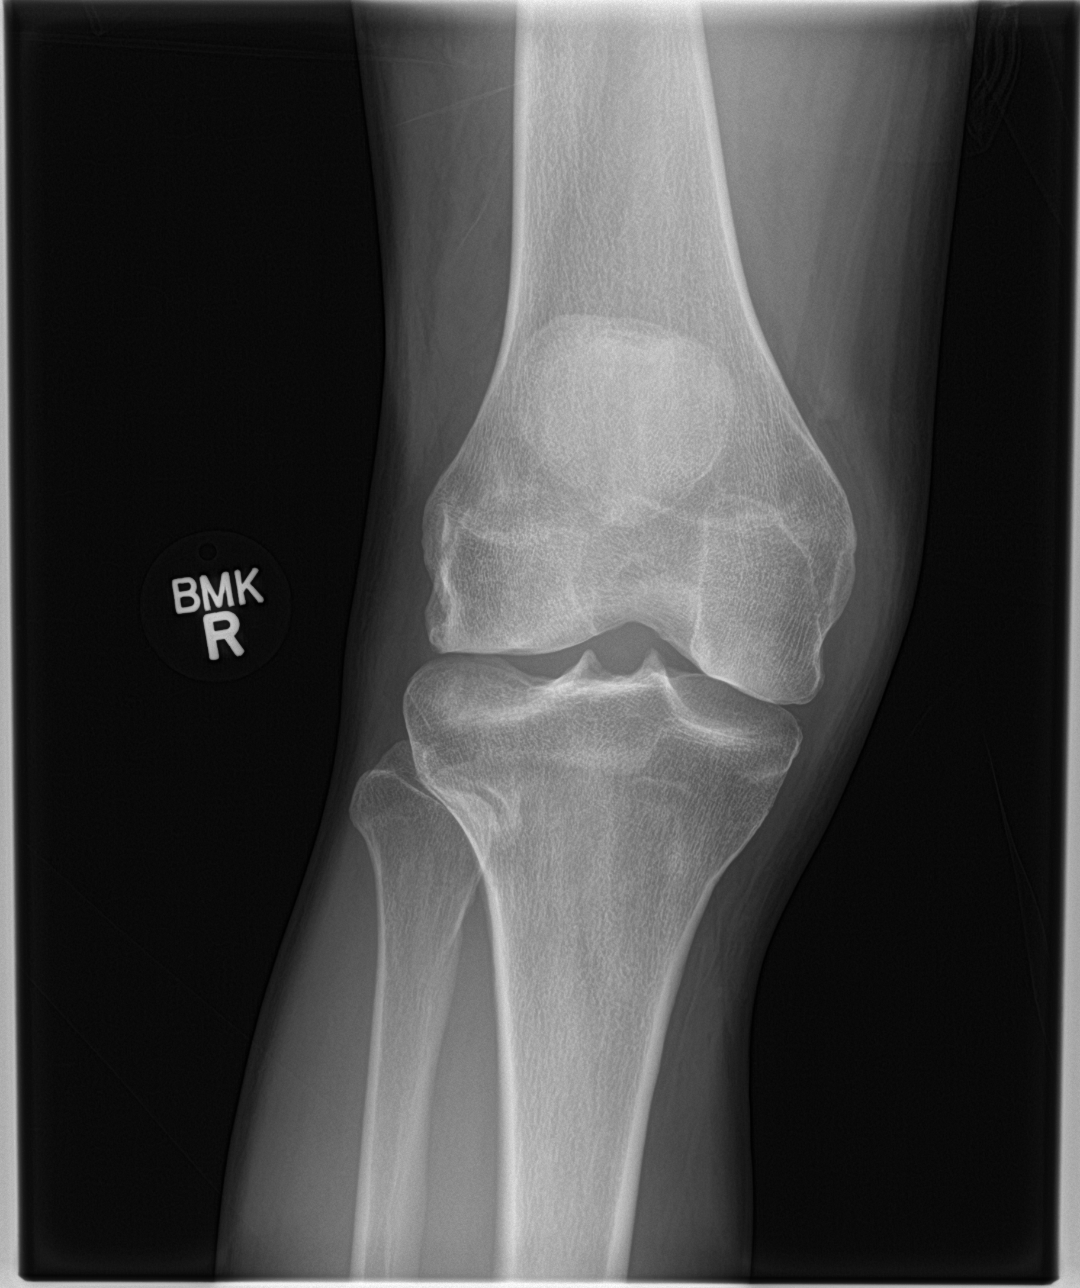
[im 2/4]
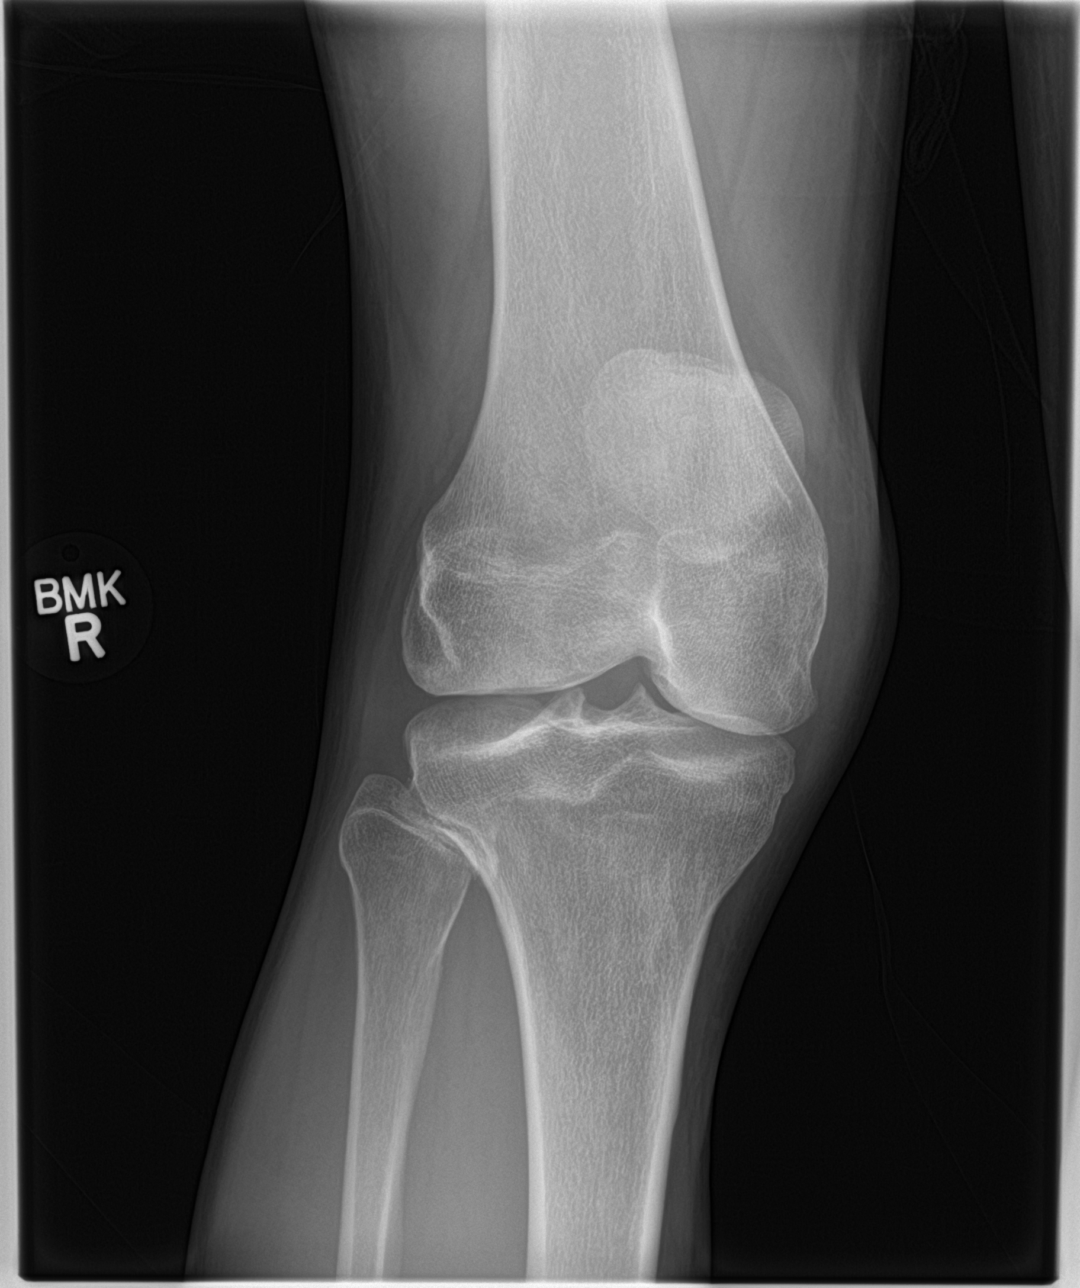
[im 3/4]
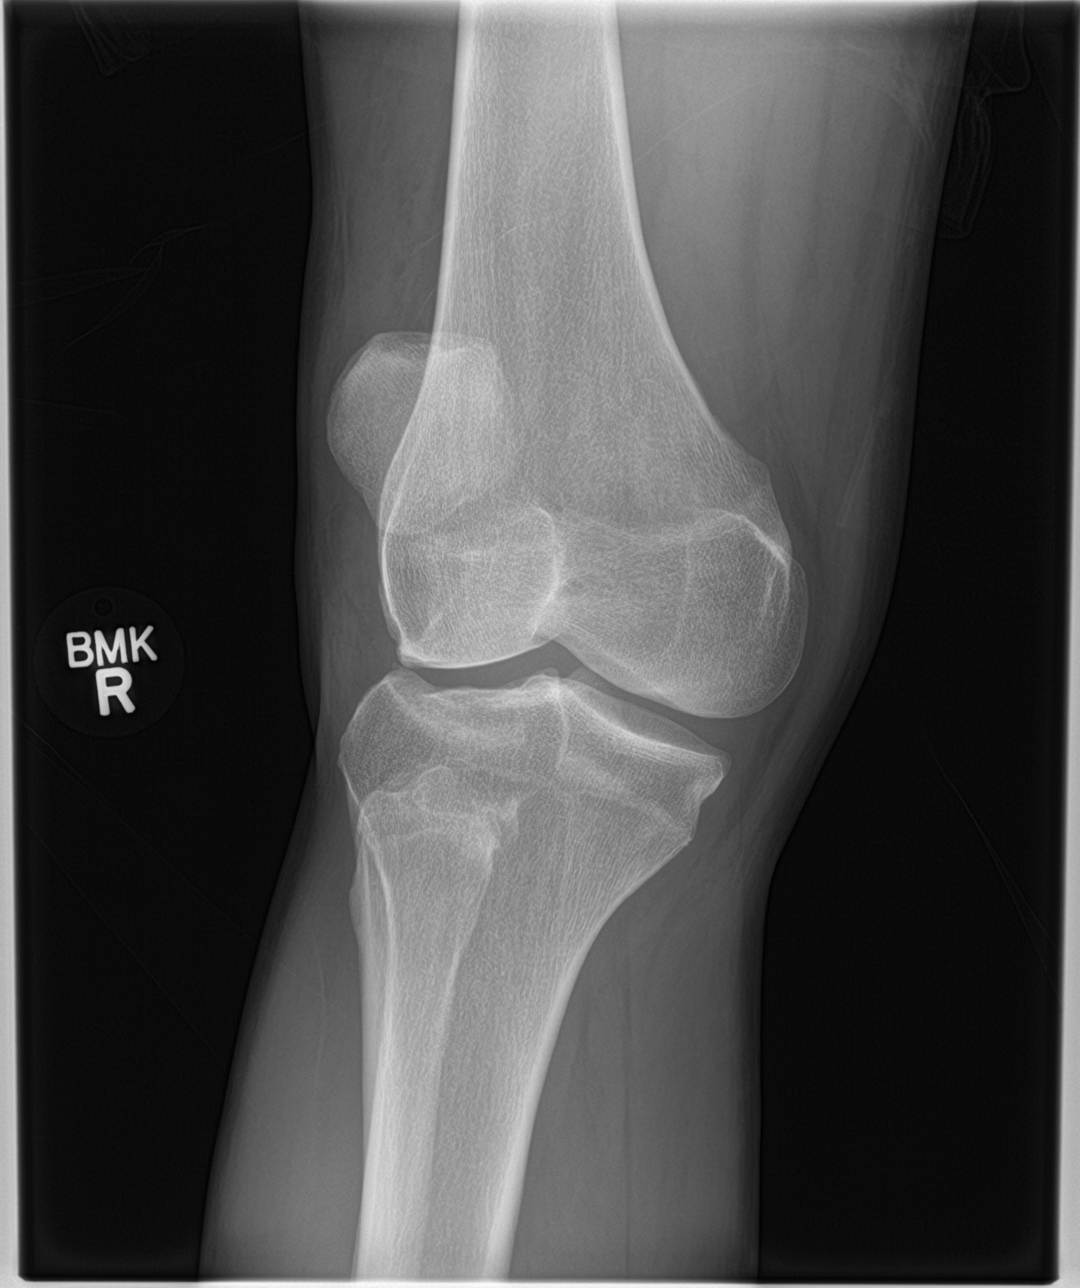
[im 4/4]
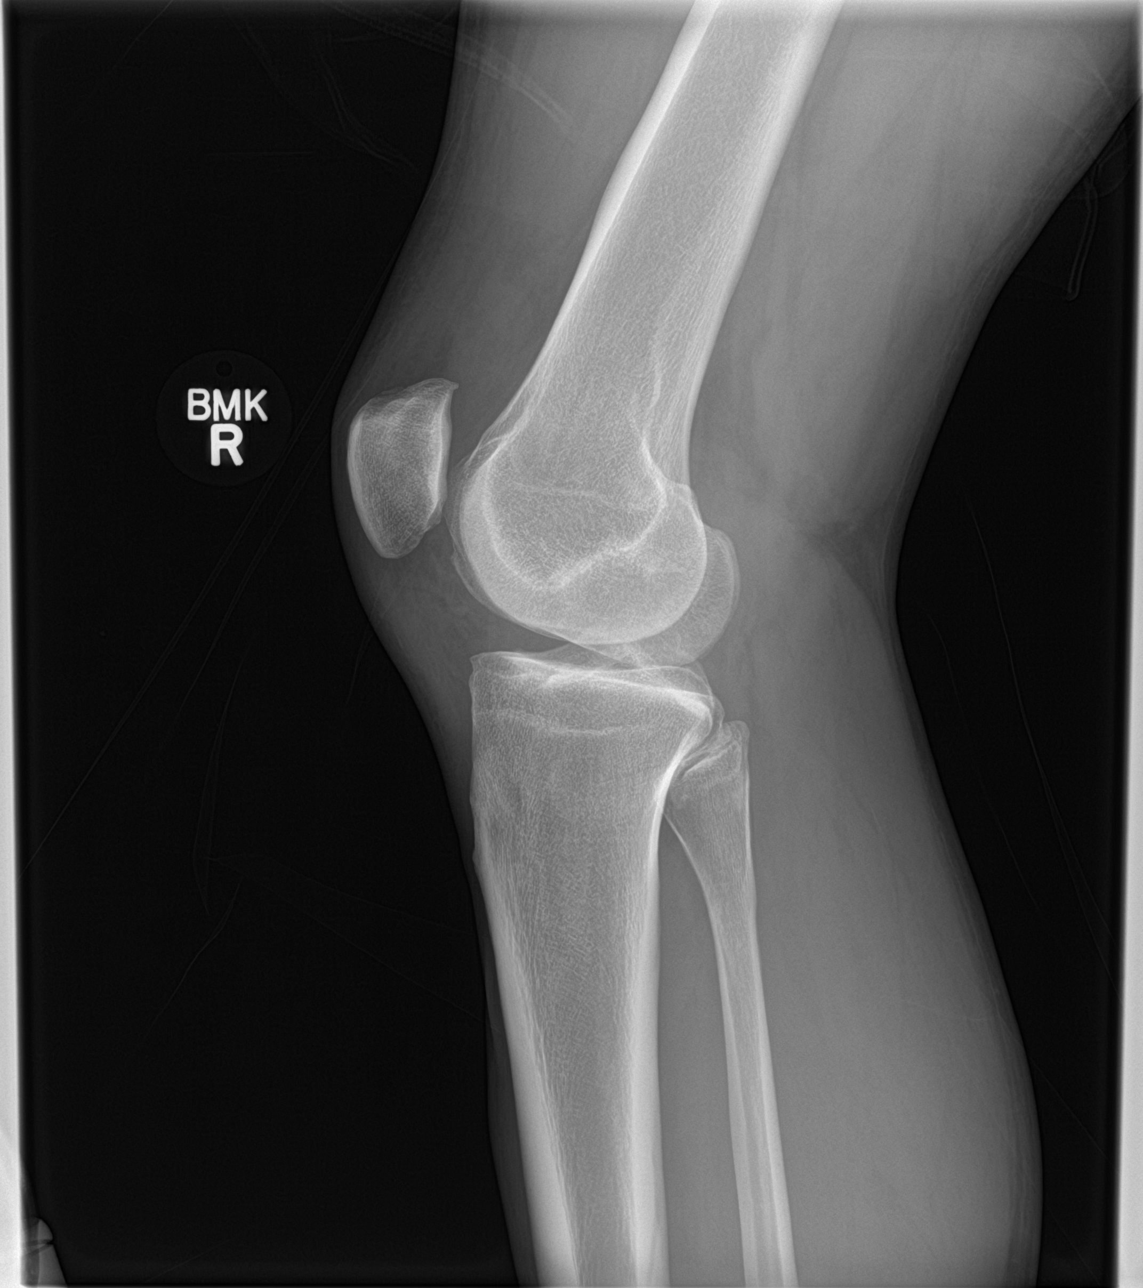

[4 of 4 positions shown; findings below may reference images not displayed]

FINDINGS: Small suprapatellar right knee joint effusion. No fracture or
dislocation. No suspicious focal osseous lesions. No significant
degenerative arthropathy. No radiopaque foreign bodies.
IMPRESSION: Small suprapatellar right knee joint effusion. No acute osseous
abnormality.
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
